# Patient Record
Sex: Male | Born: 1985 | Race: White | Hispanic: No | Marital: Single | Smoking: Current every day smoker
Health system: Southern US, Community
[De-identification: ages and names within clinical notes are randomized; demographics above are authoritative.]

## PROBLEM LIST (undated history)

## (undated) HISTORY — PX: HERNIA REPAIR: SHX51

---
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
## 2006-04-28 ENCOUNTER — Emergency Department (HOSPITAL_COMMUNITY): Admission: EM | Admit: 2006-04-28 | Discharge: 2006-04-28 | Payer: Self-pay | Admitting: Emergency Medicine

## 2008-11-16 ENCOUNTER — Emergency Department (HOSPITAL_COMMUNITY): Admission: EM | Admit: 2008-11-16 | Discharge: 2008-11-16 | Payer: Self-pay | Admitting: Family Medicine

## 2011-01-24 ENCOUNTER — Emergency Department (HOSPITAL_COMMUNITY)
Admission: EM | Admit: 2011-01-24 | Discharge: 2011-01-24 | Disposition: A | Discharge status: Home / Self Care | Attending: Emergency Medicine | Admitting: Emergency Medicine

## 2011-01-24 DIAGNOSIS — S0990XA Unspecified injury of head, initial encounter: Secondary | ICD-10-CM | POA: Insufficient documentation

## 2011-01-24 DIAGNOSIS — S0003XA Contusion of scalp, initial encounter: Secondary | ICD-10-CM | POA: Insufficient documentation

## 2011-01-24 DIAGNOSIS — Y9210 Unspecified residential institution as the place of occurrence of the external cause: Secondary | ICD-10-CM | POA: Insufficient documentation

## 2011-01-25 ENCOUNTER — Encounter (HOSPITAL_COMMUNITY): Payer: Self-pay | Admitting: Emergency Medicine

## 2011-01-25 ENCOUNTER — Emergency Department (HOSPITAL_COMMUNITY)

## 2012-07-21 ENCOUNTER — Emergency Department (HOSPITAL_COMMUNITY)
Admission: EM | Admit: 2012-07-21 | Discharge: 2012-07-21 | Disposition: A | Discharge status: Home / Self Care | Payer: Self-pay | Attending: Emergency Medicine | Admitting: Emergency Medicine

## 2012-07-21 ENCOUNTER — Encounter (HOSPITAL_COMMUNITY): Payer: Self-pay | Admitting: *Deleted

## 2012-07-21 ENCOUNTER — Emergency Department (HOSPITAL_COMMUNITY): Payer: Self-pay

## 2012-07-21 DIAGNOSIS — M25561 Pain in right knee: Secondary | ICD-10-CM

## 2012-07-21 DIAGNOSIS — M25569 Pain in unspecified knee: Secondary | ICD-10-CM | POA: Insufficient documentation

## 2012-07-21 DIAGNOSIS — F17200 Nicotine dependence, unspecified, uncomplicated: Secondary | ICD-10-CM | POA: Insufficient documentation

## 2012-07-21 DIAGNOSIS — M79651 Pain in right thigh: Secondary | ICD-10-CM

## 2012-07-21 MED ORDER — TRAMADOL HCL 50 MG PO TABS: 50.0000 mg | ORAL_TABLET | Freq: Four times a day (QID) | ORAL | Status: AC | PRN

## 2012-07-21 MED ORDER — ONDANSETRON 8 MG PO TBDP
8.0000 mg | ORAL_TABLET | Freq: Once | ORAL | Status: AC
  Administered 2012-07-21: 8 mg via ORAL
  Filled 2012-07-21: qty 1

## 2012-07-21 MED ORDER — PROMETHAZINE HCL 25 MG PO TABS: 25.0000 mg | ORAL_TABLET | Freq: Four times a day (QID) | ORAL | Status: DC | PRN

## 2012-07-21 MED ORDER — OXYCODONE-ACETAMINOPHEN 5-325 MG PO TABS
2.0000 | ORAL_TABLET | Freq: Once | ORAL | Status: AC
  Administered 2012-07-21: 2 via ORAL
  Filled 2012-07-21: qty 2
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
## 2012-07-22 NOTE — ED Provider Notes (Signed)
Medical screening examination/treatment/procedure(s) were performed by non-physician practitioner and as supervising physician I was immediately available for consultation/collaboration.  Toy Baker, MD 07/22/12 [phone]

## 2012-12-25 ENCOUNTER — Ambulatory Visit (HOSPITAL_COMMUNITY)
Admission: EM | Admit: 2012-12-25 | Discharge: 2012-12-27 | Disposition: A | Discharge status: Home / Self Care | Attending: Otolaryngology | Admitting: Otolaryngology

## 2012-12-25 ENCOUNTER — Emergency Department (HOSPITAL_COMMUNITY)

## 2012-12-25 ENCOUNTER — Encounter (HOSPITAL_COMMUNITY): Payer: Self-pay | Admitting: Emergency Medicine

## 2012-12-25 DIAGNOSIS — S02609A Fracture of mandible, unspecified, initial encounter for closed fracture: Secondary | ICD-10-CM | POA: Insufficient documentation

## 2012-12-25 DIAGNOSIS — Y9210 Unspecified residential institution as the place of occurrence of the external cause: Secondary | ICD-10-CM | POA: Insufficient documentation

## 2012-12-25 DIAGNOSIS — R1310 Dysphagia, unspecified: Secondary | ICD-10-CM | POA: Insufficient documentation

## 2012-12-25 DIAGNOSIS — F17200 Nicotine dependence, unspecified, uncomplicated: Secondary | ICD-10-CM | POA: Insufficient documentation

## 2012-12-25 MED ORDER — SODIUM CHLORIDE 0.9 % IV SOLN
INTRAVENOUS | Status: DC
  Administered 2012-12-25: 23:00:00 via INTRAVENOUS

## 2012-12-25 MED ORDER — DIPHENHYDRAMINE HCL 12.5 MG/5ML PO ELIX
12.5000 mg | ORAL_SOLUTION | Freq: Four times a day (QID) | ORAL | Status: DC | PRN
  Filled 2012-12-25: qty 10

## 2012-12-25 MED ORDER — PROMETHAZINE HCL 6.25 MG/5ML PO SYRP
25.0000 mg | ORAL_SOLUTION | ORAL | Status: DC | PRN
  Filled 2012-12-25: qty 20

## 2012-12-25 MED ORDER — KCL IN DEXTROSE-NACL 10-5-0.45 MEQ/L-%-% IV SOLN
INTRAVENOUS | Status: DC
  Administered 2012-12-26 – 2012-12-27 (×2): via INTRAVENOUS
  Filled 2012-12-25 (×4): qty 1000

## 2012-12-25 MED ORDER — SODIUM CHLORIDE 0.9 % IV SOLN
3.0000 g | Freq: Four times a day (QID) | INTRAVENOUS | Status: DC
  Administered 2012-12-25 – 2012-12-27 (×5): 3 g via INTRAVENOUS
  Filled 2012-12-25 (×9): qty 3

## 2012-12-25 MED ORDER — HYDROMORPHONE HCL PF 1 MG/ML IJ SOLN
1.0000 mg | Freq: Once | INTRAMUSCULAR | Status: AC
  Administered 2012-12-25: 1 mg via INTRAMUSCULAR
  Filled 2012-12-25: qty 1

## 2012-12-25 MED ORDER — ACETAMINOPHEN 160 MG/5ML PO SOLN
325.0000 mg | Freq: Four times a day (QID) | ORAL | Status: DC | PRN
  Filled 2012-12-25: qty 10.2

## 2012-12-25 MED ORDER — ONDANSETRON HCL 4 MG/2ML IJ SOLN: 4.0000 mg | Freq: Four times a day (QID) | INTRAMUSCULAR | Status: DC | PRN

## 2012-12-25 MED ORDER — CHLORHEXIDINE GLUCONATE 0.12 % MT SOLN
15.0000 mL | Freq: Four times a day (QID) | OROMUCOSAL | Status: DC
  Administered 2012-12-25 – 2012-12-27 (×4): 15 mL via OROMUCOSAL
  Filled 2012-12-25 (×9): qty 15

## 2012-12-25 MED ORDER — HYDROCODONE-ACETAMINOPHEN 7.5-500 MG/15ML PO SOLN
15.0000 mL | ORAL | Status: DC | PRN
  Administered 2012-12-26: 15 mL via ORAL
  Filled 2012-12-25: qty 15

## 2012-12-25 MED ORDER — MORPHINE SULFATE 2 MG/ML IJ SOLN
1.0000 mg | INTRAMUSCULAR | Status: DC | PRN
  Administered 2012-12-25 – 2012-12-26 (×5): 1 mg via INTRAVENOUS
  Filled 2012-12-25 (×5): qty 1

## 2012-12-25 MED ORDER — DIPHENHYDRAMINE HCL 50 MG/ML IJ SOLN: 12.5000 mg | Freq: Four times a day (QID) | INTRAMUSCULAR | Status: DC | PRN
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
## 2012-12-25 NOTE — ED Provider Notes (Signed)
Medical screening examination/treatment/procedure(s) were performed by non-physician practitioner and as supervising physician I was immediately available for consultation/collaboration.   David H Yao, MD 12/25/12 2302 
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
## 2012-12-26 ENCOUNTER — Encounter (HOSPITAL_COMMUNITY)
Admission: EM | Disposition: A | Discharge status: Home / Self Care | Payer: Self-pay | Source: Home / Self Care | Attending: Emergency Medicine

## 2012-12-26 ENCOUNTER — Observation Stay (HOSPITAL_COMMUNITY): Admitting: Anesthesiology

## 2012-12-26 ENCOUNTER — Ambulatory Visit (HOSPITAL_COMMUNITY)

## 2012-12-26 ENCOUNTER — Encounter (HOSPITAL_COMMUNITY): Payer: Self-pay | Admitting: Anesthesiology

## 2012-12-26 HISTORY — PX: ORIF MANDIBULAR FRACTURE: SHX2127

## 2012-12-26 SURGERY — OPEN REDUCTION INTERNAL FIXATION (ORIF) MANDIBULAR FRACTURE
Anesthesia: General | Site: Mouth | Wound class: Clean Contaminated

## 2012-12-26 MED ORDER — LIDOCAINE HCL (CARDIAC) 20 MG/ML IV SOLN
INTRAVENOUS | Status: DC | PRN
  Administered 2012-12-26: 80 mg via INTRAVENOUS

## 2012-12-26 MED ORDER — OXYCODONE HCL 5 MG PO TABS: 5.0000 mg | ORAL_TABLET | Freq: Once | ORAL | Status: DC | PRN

## 2012-12-26 MED ORDER — LIDOCAINE-EPINEPHRINE 1 %-1:100000 IJ SOLN
INTRAMUSCULAR | Status: DC | PRN
  Administered 2012-12-26: 10 mL

## 2012-12-26 MED ORDER — ONDANSETRON HCL 4 MG/2ML IJ SOLN
INTRAMUSCULAR | Status: DC | PRN
  Administered 2012-12-26: 4 mg via INTRAVENOUS

## 2012-12-26 MED ORDER — HYDROMORPHONE HCL PF 1 MG/ML IJ SOLN
0.2500 mg | INTRAMUSCULAR | Status: DC | PRN
  Administered 2012-12-26 (×2): 0.5 mg via INTRAVENOUS

## 2012-12-26 MED ORDER — DEXAMETHASONE SODIUM PHOSPHATE 10 MG/ML IJ SOLN
10.0000 mg | Freq: Three times a day (TID) | INTRAMUSCULAR | Status: DC
  Administered 2012-12-26 – 2012-12-27 (×4): 10 mg via INTRAVENOUS
  Filled 2012-12-26 (×7): qty 1

## 2012-12-26 MED ORDER — PROPOFOL 10 MG/ML IV BOLUS
INTRAVENOUS | Status: DC | PRN
  Administered 2012-12-26: 200 mg via INTRAVENOUS

## 2012-12-26 MED ORDER — METOCLOPRAMIDE HCL 5 MG/ML IJ SOLN
INTRAMUSCULAR | Status: DC | PRN
  Administered 2012-12-26: 10 mg via INTRAVENOUS

## 2012-12-26 MED ORDER — OXYMETAZOLINE HCL 0.05 % NA SOLN
NASAL | Status: DC | PRN
  Administered 2012-12-26: 1 via NASAL

## 2012-12-26 MED ORDER — LACTATED RINGERS IV SOLN
INTRAVENOUS | Status: DC | PRN
  Administered 2012-12-26 (×2): via INTRAVENOUS

## 2012-12-26 MED ORDER — FENTANYL CITRATE 0.05 MG/ML IJ SOLN
INTRAMUSCULAR | Status: DC | PRN
  Administered 2012-12-26: 100 ug via INTRAVENOUS
  Administered 2012-12-26 (×2): 50 ug via INTRAVENOUS

## 2012-12-26 MED ORDER — ONDANSETRON HCL 4 MG/2ML IJ SOLN: 4.0000 mg | Freq: Once | INTRAMUSCULAR | Status: DC | PRN

## 2012-12-26 MED ORDER — SUCCINYLCHOLINE CHLORIDE 20 MG/ML IJ SOLN
INTRAMUSCULAR | Status: DC | PRN
  Administered 2012-12-26: 120 mg via INTRAVENOUS

## 2012-12-26 MED ORDER — OXYCODONE HCL 5 MG/5ML PO SOLN: 5.0000 mg | Freq: Once | ORAL | Status: DC | PRN

## 2012-12-26 MED ORDER — MIDAZOLAM HCL 5 MG/5ML IJ SOLN
INTRAMUSCULAR | Status: DC | PRN
  Administered 2012-12-26: 2 mg via INTRAVENOUS

## 2012-12-26 MED ORDER — 0.9 % SODIUM CHLORIDE (POUR BTL) OPTIME
TOPICAL | Status: DC | PRN
  Administered 2012-12-26: 1000 mL

## 2012-12-26 SURGICAL SUPPLY — 49 items
BIT DRILL TWIST 1.3X35MM (BIT) ×1 IMPLANT
BLADE SURG 15 STRL LF DISP TIS (BLADE) ×1 IMPLANT
BLADE SURG 15 STRL SS (BLADE) ×1
BLADE SURG ROTATE 9660 (MISCELLANEOUS) IMPLANT
CANISTER SUCTION 2500CC (MISCELLANEOUS) ×2 IMPLANT
CLEANER TIP ELECTROSURG 2X2 (MISCELLANEOUS) ×2 IMPLANT
CLOTH BEACON ORANGE TIMEOUT ST (SAFETY) ×2 IMPLANT
COVER SURGICAL LIGHT HANDLE (MISCELLANEOUS) ×2 IMPLANT
DECANTER SPIKE VIAL GLASS SM (MISCELLANEOUS) ×2 IMPLANT
DRILL TWIST 1.3X35MM (BIT) ×2
DRSG NASOPORE 8CM (GAUZE/BANDAGES/DRESSINGS) ×2 IMPLANT
ELECT COATED BLADE 2.86 ST (ELECTRODE) ×2 IMPLANT
ELECT REM PT RETURN 9FT ADLT (ELECTROSURGICAL) ×2
ELECTRODE REM PT RTRN 9FT ADLT (ELECTROSURGICAL) ×1 IMPLANT
GLOVE BIO SURGEON STRL SZ7 (GLOVE) ×2 IMPLANT
GLOVE SURG SS PI 7.5 STRL IVOR (GLOVE) ×4 IMPLANT
GOWN STRL NON-REIN LRG LVL3 (GOWN DISPOSABLE) ×4 IMPLANT
KIT BASIN OR (CUSTOM PROCEDURE TRAY) ×2 IMPLANT
KIT ROOM TURNOVER OR (KITS) ×2 IMPLANT
NEEDLE 27GAX1X1/2 (NEEDLE) ×2 IMPLANT
NS IRRIG 1000ML POUR BTL (IV SOLUTION) ×2 IMPLANT
PAD ARMBOARD 7.5X6 YLW CONV (MISCELLANEOUS) ×4 IMPLANT
PENCIL BUTTON HOLSTER BLD 10FT (ELECTRODE) ×2 IMPLANT
PLATE 4H FRACTURE (Plate) ×2 IMPLANT
PLATE MNDBLE 3D 4X2 SQUARE (Plate) ×2 IMPLANT
SCISSORS WIRE ANG 4 3/4 DISP (INSTRUMENTS) ×2 IMPLANT
SCREW BONE CROSS PIN 2.0X10MM (Screw) ×6 IMPLANT
SCREW MNDBLE 2.0X8 BONE (Screw) ×8 IMPLANT
SCREW MNDBLE 2.3X10MM BONE (Screw) ×6 IMPLANT
SCREW UPPER FACE 2.0X12MM (Screw) ×4 IMPLANT
SCREW UPPER FACE 2.0X8MM (Screw) ×4 IMPLANT
SUT ETHILON 4 0 CL P 3 (SUTURE) IMPLANT
SUT MON AB 3-0 SH 27 (SUTURE)
SUT MON AB 3-0 SH27 (SUTURE) IMPLANT
SUT PROLENE 6 0 PC 1 (SUTURE) IMPLANT
SUT SILK 2 0 SH (SUTURE) ×8 IMPLANT
SUT STEEL 0 (SUTURE)
SUT STEEL 0 18XMFL TIE 17 (SUTURE) IMPLANT
SUT STEEL 1 (SUTURE) IMPLANT
SUT STEEL 2 (SUTURE) IMPLANT
SUT STEEL 4 (SUTURE) ×2 IMPLANT
SUT VIC AB 3-0 SH 27 (SUTURE) ×2
SUT VIC AB 3-0 SH 27X BRD (SUTURE) ×2 IMPLANT
SUT VICRYL 4-0 PS2 18IN ABS (SUTURE) IMPLANT
TOWEL OR 17X24 6PK STRL BLUE (TOWEL DISPOSABLE) ×2 IMPLANT
TOWEL OR 17X26 10 PK STRL BLUE (TOWEL DISPOSABLE) ×2 IMPLANT
TRAY ENT MC OR (CUSTOM PROCEDURE TRAY) ×2 IMPLANT
TRAY FOLEY CATH 14FRSI W/METER (CATHETERS) IMPLANT
WATER STERILE IRR 1000ML POUR (IV SOLUTION) ×2 IMPLANT

## 2012-12-26 NOTE — Anesthesia Procedure Notes (Signed)
Procedure Name: Intubation Date/Time: 12/26/2012 1:53 AM Performed by: Molli Hazard Pre-anesthesia Checklist: Patient identified, Emergency Drugs available, Suction available and Patient being monitored Patient Re-evaluated:Patient Re-evaluated prior to inductionOxygen Delivery Method: Circle system utilized Preoxygenation: Pre-oxygenation with 100% oxygen Intubation Type: IV induction, Rapid sequence and Cricoid Pressure applied Laryngoscope Size: Miller and 2 Nasal Tubes: Left, Magill forceps- large, utilized, Nasal Rae and Nasal prep performed Tube size: 7.5 mm Number of attempts: 1 Placement Confirmation: ETT inserted through vocal cords under direct vision,  positive ETCO2 and breath sounds checked- equal and bilateral Tube secured with: Tape Dental Injury: Teeth and Oropharynx as per pre-operative assessment

## 2012-12-26 NOTE — Preoperative (Signed)
Beta Blockers   Reason not to administer Beta Blockers:Not Applicable 

## 2012-12-26 NOTE — Progress Notes (Signed)
12/26/2012 5:32 PM  Jordan Steele [id number]  Post op check    Temp:  [98.1 F (36.7 C)-99.9 F (37.7 C)] 98.3 F (36.8 C) (01/02 1455) Pulse Rate:  [71-92] 76  (01/02 1455) Resp:  [14-20] 18  (01/02 1455) BP: (127-154)/(76-93) 127/80 mmHg (01/02 1455) SpO2:  [96 %-100 %] 96 % (01/02 1455) Weight:  [74.6 kg (164 lb 7.4 oz)-81.647 kg (180 lb)] 74.6 kg (164 lb 7.4 oz) (01/02 0418),     Intake/Output Summary (Last 24 hours) at 12/26/12 1732 Last data filed at 12/26/12 1235  Gross per 24 hour  Intake 1825.83 ml  Output    400 ml  Net 1425.83 ml    No results found for this or any previous visit (from the past 24 hour(s)).  SUBJECTIVE:  Just starting to drink.  + spont void.  Pain controlled. No breathing difficulty, but reluctant to talk. C/o LEFT side higher than RIGHT.  OBJECTIVE:   ROM mandible OK.  Sl RIGHT soft palate ecchymosis, but no swelling of tongue or pharynx.  Could not test occlusion.  IMPRESSION:  Satisfactory check. No obvious reason why he can't talk or eat either one.  PLAN:  Advance diet.  Probably discharge in AM.  Lazarus Salines, [place]

## 2012-12-26 NOTE — Progress Notes (Signed)
After coaxing, frequent oral rinses, ice bags to bil jaws, cleansing of tongue coated with blood and pain medication (Morphine IV) pt is able to swallow, approximately 4pm.  Patient is using the oral suction less.  Patient has a fear that he will go to sleep and he want be able to control secretions.  I encouraged him that he will improve over time but should be able to control secretions, cough them up if sitting up and lay to the side.  He was educated to take small sips and swallow slowly.  He is much more awake this afternoon and able to speak his words clearer but still has to repeat some words.   He will try soft but solid food tonight with my presence for safety and reassurance.  Continue to monitor and reassure patient, keep pain to mininum of #4, and switch pain medication to oral most likely crush and put in pudding or applesauce.  Pt has been prepared for his discharge tomorrow.  Bonney Leitz RN

## 2012-12-26 NOTE — Progress Notes (Signed)
Ihor Meinzer [id number] Admitted to 5528: 12/26/2012 4:20AM Attending Provider: Melvenia Beam, MD    Jordan Steele is a 27 y.o. male patient admitted from ED awake, alert  & orientated  X 3,  Full Code, VSS - Blood pressure 138/82, pulse 87, temperature 98.8 F (37.1 C), temperature source Axillary, resp. rate 20, height 6' (1.829 m), weight 72.8 kg (160 lb 7.9 oz), SpO2 97.00%RA, no c/o shortness of breath, no c/o chest pain, no distress noted.   IV site WDL:  forearm right, condition patent and no redness with a transparent dsg that's clean dry and intact.  Allergies:  No Known Allergies   History reviewed. No pertinent past medical history.  History:  obtained from the patient.  Pt orientation to unit, room and routine. Information packet given to patient and safety video watched.  Admission INP armband ID verified with patient/family, and in place. SR up x 2, fall risk assessment complete with Patient verbalizes understanding of risks associated with falls. Pt verbalizes an understanding of how to use the call bell and to call for help before getting out of bed.  Skin, clean-dry- intact without evidence of bruising, or skin tears.  Pt has sutures to his lip s/p Mandibulo-maxillary fixation. Suction and Yankauer set up at bedside.    No evidence of skin break down noted on exam.    Will cont to monitor and assist as needed.  Julien Nordmann [facility], RN 12/26/2012 4:51 AM

## 2012-12-26 NOTE — Anesthesia Preprocedure Evaluation (Signed)
Anesthesia Evaluation  Patient identified by MRN, date of birth, ID band Patient awake    Reviewed: Allergy & Precautions, H&P , NPO status , Patient's Chart, lab work & pertinent test results  Airway  TM Distance: >3 FB   Mouth opening: Limited Mouth Opening  Dental  (+)    Pulmonary Current Smoker,  breath sounds clear to auscultation        Cardiovascular Rhythm:Regular Rate:Normal     Neuro/Psych    GI/Hepatic   Endo/Other    Renal/GU      Musculoskeletal   Abdominal   Peds  Hematology   Anesthesia Other Findings   Reproductive/Obstetrics                           Anesthesia Physical Anesthesia Plan  ASA: I and emergent  Anesthesia Plan: General   Post-op Pain Management:    Induction: Intravenous  Airway Management Planned: Nasal ETT  Additional Equipment:   Intra-op Plan:   Post-operative Plan: Extubation in OR  Informed Consent: I have reviewed the patients History and Physical, chart, labs and discussed the procedure including the risks, benefits and alternatives for the proposed anesthesia with the patient or authorized representative who has indicated his/her understanding and acceptance.   Dental advisory given  Plan Discussed with: CRNA, Anesthesiologist and Surgeon  Anesthesia Plan Comments:         Anesthesia Quick Evaluation

## 2012-12-26 NOTE — Anesthesia Postprocedure Evaluation (Signed)
  Anesthesia Post-op Note  Patient: Jordan Steele  Procedure(s) Performed: Procedure(s) (LRB) with comments: OPEN REDUCTION INTERNAL FIXATION (ORIF) MANDIBULAR FRACTURE (N/A)  Patient Location: PACU  Anesthesia Type:General  Level of Consciousness: awake, alert  and oriented  Airway and Oxygen Therapy: Patient Spontanous Breathing and Patient connected to nasal cannula oxygen  Post-op Pain: moderate  Post-op Assessment: Post-op Vital signs reviewed  Post-op Vital Signs: Reviewed  Complications: No apparent anesthesia complications

## 2012-12-26 NOTE — Transfer of Care (Signed)
Immediate Anesthesia Transfer of Care Note  Patient: Jordan Steele  Procedure(s) Performed: Procedure(s) (LRB) with comments: OPEN REDUCTION INTERNAL FIXATION (ORIF) MANDIBULAR FRACTURE (N/A)  Patient Location: PACU  Anesthesia Type:General  Level of Consciousness: awake, alert  and oriented  Airway & Oxygen Therapy: Patient Spontanous Breathing and Patient connected to nasal cannula oxygen  Post-op Assessment: Report given to PACU RN, Post -op Vital signs reviewed and stable   Post vital signs: Reviewed and stable  Complications: No apparent anesthesia complications
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
## 2012-12-26 NOTE — Plan of Care (Signed)
Problem: Phase I Progression Outcomes Goal: Initial discharge plan identified Outcome: Completed/Met Date Met:  12/26/12 todays d/c canceled due to pt difficulty in swallowing, for safetys sakes

## 2012-12-26 NOTE — Plan of Care (Signed)
Problem: Phase I Progression Outcomes Goal: Sutures/staples intact Outcome: Completed/Met Date Met:  12/26/12 Intact oral surgical site
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
## 2012-12-26 NOTE — Plan of Care (Signed)
Problem: Phase I Progression Outcomes Goal: Tubes/drains patent Outcome: Not Applicable Date Met:  12/26/12 No tubes or drains
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
## 2012-12-27 NOTE — Progress Notes (Signed)
1015 Report called to nurse at [facility]. Spoke with Tonka,LPN also discharge papers were faxed to nurse Tonka,LPN she stated she did receive all the information. Left jaw remains slightly swollen with suture inside of mouth.

## 2012-12-30 ENCOUNTER — Encounter (HOSPITAL_COMMUNITY): Payer: Self-pay | Admitting: Otolaryngology

## 2014-07-29 ENCOUNTER — Emergency Department (HOSPITAL_COMMUNITY)

## 2014-07-29 ENCOUNTER — Emergency Department (HOSPITAL_COMMUNITY)
Admission: EM | Admit: 2014-07-29 | Discharge: 2014-07-29 | Disposition: A | Discharge status: Home / Self Care | Attending: Emergency Medicine | Admitting: Emergency Medicine

## 2014-07-29 ENCOUNTER — Encounter (HOSPITAL_COMMUNITY): Payer: Self-pay | Admitting: Emergency Medicine

## 2014-07-29 DIAGNOSIS — R51 Headache: Secondary | ICD-10-CM | POA: Insufficient documentation

## 2014-07-29 DIAGNOSIS — R05 Cough: Secondary | ICD-10-CM | POA: Insufficient documentation

## 2014-07-29 DIAGNOSIS — J069 Acute upper respiratory infection, unspecified: Secondary | ICD-10-CM | POA: Insufficient documentation

## 2014-07-29 DIAGNOSIS — R197 Diarrhea, unspecified: Secondary | ICD-10-CM | POA: Insufficient documentation

## 2014-07-29 DIAGNOSIS — R059 Cough, unspecified: Secondary | ICD-10-CM | POA: Insufficient documentation

## 2014-07-29 DIAGNOSIS — R509 Fever, unspecified: Secondary | ICD-10-CM | POA: Insufficient documentation

## 2014-07-29 DIAGNOSIS — F17200 Nicotine dependence, unspecified, uncomplicated: Secondary | ICD-10-CM | POA: Insufficient documentation

## 2014-07-29 LAB — RAPID STREP SCREEN (MED CTR MEBANE ONLY): Streptococcus, Group A Screen (Direct): NEGATIVE

## 2014-07-29 MED ORDER — HYDROCODONE-HOMATROPINE 5-1.5 MG/5ML PO SYRP: 5.0000 mL | ORAL_SOLUTION | Freq: Four times a day (QID) | ORAL | Status: DC | PRN

## 2014-07-29 MED ORDER — GUAIFENESIN 100 MG/5ML PO LIQD: 100.0000 mg | ORAL | Status: DC | PRN

## 2014-07-29 NOTE — ED Notes (Signed)
Pt reports cough, sore throat x 1 week. Intermittent fever of 100.0 reported

## 2014-07-29 NOTE — ED Provider Notes (Signed)
CSN: [id number]     Arrival date & time 07/29/14  1246 History  This chart was scribed for Fayrene HelperBowie Maksymilian Mabey, PA-C, working with Doug SouSam Jacubowitz, MD by Chestine SporeSoijett Blue, ED Scribe. The patient was seen in room WTR5/WTR5 at 12:59 PM.     Chief Complaint  Patient presents with  . Cough    x 1 week  . Sore Throat    x1 week      The history is provided by the patient. No language interpreter was used.   HPI Comments: Jordan Steele is a 28 y.o. male who presents to the Emergency Department complaining of cough and sore throat onset 1 week. He states that he is having associated symptoms of cough, intermittent fever of 100, rhinorrhea, sneezing, diarrhea, and HA.  He states that he has tried Dayquil and Nyquil with no relief for his symptoms. He states that he has taken Tylenol extra strength for his HA with no relief. He denies SOB, and N/V. He states that he has had some sick contact. He denies any recent travel. He states that he smokes.    History reviewed. No pertinent past medical history. Past Surgical History  Procedure Laterality Date  . Hernia repair    . Orif mandibular fracture  12/26/2012    Procedure: OPEN REDUCTION INTERNAL FIXATION (ORIF) MANDIBULAR FRACTURE;  Surgeon: Melvenia BeamMitchell Gore, MD;  [facility];  Service: ENT;  Laterality: N/A;   History reviewed. No pertinent family history. History  Substance Use Topics  . Smoking status: Current Every Day Smoker -- 0.50 packs/day  . Smokeless tobacco: Never Used  . Alcohol Use: No    Review of Systems  Constitutional: Positive for fever. Negative for chills.  HENT: Positive for rhinorrhea and sore throat.   Respiratory: Positive for cough. Negative for shortness of breath.   Gastrointestinal: Positive for diarrhea. Negative for nausea and vomiting.  Neurological: Positive for headaches.      Allergies  Review of patient's allergies indicates no known allergies.  Home Medications   Prior to Admission medications   Not on File    BP 121/78  Pulse 94  Temp(Src) 98.4 F (36.9 C) (Oral)  Resp 18  SpO2 98%  Physical Exam  Nursing note and vitals reviewed. Constitutional: He is oriented to person, place, and time. He appears well-developed and well-nourished. No distress.  HENT:  Head: Normocephalic and atraumatic.  Right Ear: Tympanic membrane and ear canal normal.  Left Ear: Tympanic membrane and ear canal normal.  Nose: Nose normal.  Mouth/Throat: Uvula is midline. No oropharyngeal exudate.   No sinus tenderness.    Throat: uvula midline, mild post oropharyngeal erythema, no tonsillar enlargement or exudates noted.    Eyes: EOM are normal.  Neck: Neck supple. No tracheal deviation present.  Cardiovascular: Normal rate and normal heart sounds.   Pulmonary/Chest: Effort normal and breath sounds normal. No respiratory distress.  Musculoskeletal: Normal range of motion.  Neurological: He is alert and oriented to person, place, and time.  Skin: Skin is warm and dry.  Psychiatric: He has a normal mood and affect. His behavior is normal.    ED Course  Procedures (including critical care time) DIAGNOSTIC STUDIES: Oxygen Saturation is 98% on room air, normal by my interpretation.    COORDINATION OF CARE: 1:03 PM-Pt with URI sxs.  Given hx of tobacco abuse, and persistent cough, Discussed treatment plan which includes CXR and Rapid Strep Screen with pt at bedside and pt agreed to plan.   2:13  PM cxr unremarkable, strep test neg.  Will treat sxs of URI.  Smoking cessation discussed.    Labs Review Labs Reviewed  RAPID STREP SCREEN    Imaging Review Dg Chest 2 View  07/29/2014   CLINICAL DATA:  Cough  EXAM: CHEST  2 VIEW  COMPARISON:  None.  FINDINGS: Lungs are clear. Heart size and pulmonary vascularity are normal. No adenopathy. No bone lesions. Incidental note is made of an azygos lobe on the right, an anatomic variant.  IMPRESSION: No edema or consolidation.   Electronically Signed   By: Bretta Bang M.D.   On: 07/29/2014 13:25     EKG Interpretation None      MDM   Final diagnoses:  URI (upper respiratory infection)    BP 121/78  Pulse 94  Temp(Src) 98.4 F (36.9 C) (Oral)  Resp 18  SpO2 98%  I have reviewed nursing notes and vital signs. I personally reviewed the imaging tests through PACS system  I reviewed available ER/hospitalization records thought the EMR   I personally performed the services described in this documentation, which was scribed in my presence. The recorded information has been reviewed and is accurate.    Fayrene Helper, PA-C 07/29/14 1413

## 2014-07-29 NOTE — ED Provider Notes (Signed)
Medical screening examination/treatment/procedure(s) were performed by non-physician practitioner and as supervising physician I was immediately available for consultation/collaboration.   EKG Interpretation None       Doug SouSam Esparanza Krider, MD 07/29/14 1623

## 2014-07-29 NOTE — Discharge Instructions (Signed)
Upper Respiratory Infection, Adult An upper respiratory infection (URI) is also sometimes known as the common cold. The upper respiratory tract includes the nose, sinuses, throat, trachea, and bronchi. Bronchi are the airways leading to the lungs. Most people improve within 1 week, but symptoms can last up to 2 weeks. A residual cough may last even longer.  CAUSES Many different viruses can infect the tissues lining the upper respiratory tract. The tissues become irritated and inflamed and often become very moist. Mucus production is also common. A cold is contagious. You can easily spread the virus to others by oral contact. This includes kissing, sharing a glass, coughing, or sneezing. Touching your mouth or nose and then touching a Rayo, which is then touched by another person, can also spread the virus. SYMPTOMS  Symptoms typically develop 1 to 3 days after you come in contact with a cold virus. Symptoms vary from person to person. They may include:  Runny nose.  Sneezing.  Nasal congestion.  Sinus irritation.  Sore throat.  Loss of voice (laryngitis).  Cough.  Fatigue.  Muscle aches.  Loss of appetite.  Headache.  Low-grade fever. DIAGNOSIS  You might diagnose your own cold based on familiar symptoms, since most people get a cold 2 to 3 times a year. Your caregiver can confirm this based on your exam. Most importantly, your caregiver can check that your symptoms are not due to another disease such as strep throat, sinusitis, pneumonia, asthma, or epiglottitis. Blood tests, throat tests, and X-rays are not necessary to diagnose a common cold, but they may sometimes be helpful in excluding other more serious diseases. Your caregiver will decide if any further tests are required. RISKS AND COMPLICATIONS  You may be at risk for a more severe case of the common cold if you smoke cigarettes, have chronic heart disease (such as heart failure) or lung disease (such as asthma), or if  you have a weakened immune system. The very young and very old are also at risk for more serious infections. Bacterial sinusitis, middle ear infections, and bacterial pneumonia can complicate the common cold. The common cold can worsen asthma and chronic obstructive pulmonary disease (COPD). Sometimes, these complications can require emergency medical care and may be life-threatening. PREVENTION  The best way to protect against getting a cold is to practice good hygiene. Avoid oral or hand contact with people with cold symptoms. Wash your hands often if contact occurs. There is no clear evidence that vitamin C, vitamin E, echinacea, or exercise reduces the chance of developing a cold. However, it is always recommended to get plenty of rest and practice good nutrition. TREATMENT  Treatment is directed at relieving symptoms. There is no cure. Antibiotics are not effective, because the infection is caused by a virus, not by bacteria. Treatment may include:  Increased fluid intake. Sports drinks offer valuable electrolytes, sugars, and fluids.  Breathing heated mist or steam (vaporizer or shower).  Eating chicken soup or other clear broths, and maintaining good nutrition.  Getting plenty of rest.  Using gargles or lozenges for comfort.  Controlling fevers with ibuprofen or acetaminophen as directed by your caregiver.  Increasing usage of your inhaler if you have asthma. Zinc gel and zinc lozenges, taken in the first 24 hours of the common cold, can shorten the duration and lessen the severity of symptoms. Pain medicines may help with fever, muscle aches, and throat pain. A variety of non-prescription medicines are available to treat congestion and runny nose. Your caregiver   can make recommendations and may suggest nasal or lung inhalers for other symptoms.  HOME CARE INSTRUCTIONS   Only take over-the-counter or prescription medicines for pain, discomfort, or fever as directed by your  caregiver.  Use a warm mist humidifier or inhale steam from a shower to increase air moisture. This may keep secretions moist and make it easier to breathe.  Drink enough water and fluids to keep your urine clear or pale yellow.  Rest as needed.  Return to work when your temperature has returned to normal or as your caregiver advises. You may need to stay home longer to avoid infecting others. You can also use a face mask and careful hand washing to prevent spread of the virus. SEEK MEDICAL CARE IF:   After the first few days, you feel you are getting worse rather than better.  You need your caregiver's advice about medicines to control symptoms.  You develop chills, worsening shortness of breath, or brown or red sputum. These may be signs of pneumonia.  You develop yellow or brown nasal discharge or pain in the face, especially when you bend forward. These may be signs of sinusitis.  You develop a fever, swollen neck glands, pain with swallowing, or white areas in the back of your throat. These may be signs of strep throat. SEEK IMMEDIATE MEDICAL CARE IF:   You have a fever.  You develop severe or persistent headache, ear pain, sinus pain, or chest pain.  You develop wheezing, a prolonged cough, cough up blood, or have a change in your usual mucus (if you have chronic lung disease).  You develop sore muscles or a stiff neck. Document Released: 06/06/2001 Document Revised: 03/04/2012 Document Reviewed: 03/18/2014 ExitCare Patient Information 2015 [employer]. This information is not intended to replace advice given to you by your health care provider. Make sure you discuss any questions you have with your health care provider.  

## 2014-07-31 LAB — CULTURE, GROUP A STREP

## 2014-09-22 ENCOUNTER — Emergency Department (HOSPITAL_COMMUNITY)
Admission: EM | Admit: 2014-09-22 | Discharge: 2014-09-22 | Disposition: A | Discharge status: Home / Self Care | Payer: PRIVATE HEALTH INSURANCE | Attending: Emergency Medicine | Admitting: Emergency Medicine

## 2014-09-22 ENCOUNTER — Encounter (HOSPITAL_COMMUNITY): Payer: Self-pay | Admitting: Emergency Medicine

## 2014-09-22 DIAGNOSIS — M5430 Sciatica, unspecified side: Secondary | ICD-10-CM | POA: Insufficient documentation

## 2014-09-22 DIAGNOSIS — M5431 Sciatica, right side: Secondary | ICD-10-CM

## 2014-09-22 DIAGNOSIS — R21 Rash and other nonspecific skin eruption: Secondary | ICD-10-CM | POA: Insufficient documentation

## 2014-09-22 DIAGNOSIS — R209 Unspecified disturbances of skin sensation: Secondary | ICD-10-CM | POA: Insufficient documentation

## 2014-09-22 DIAGNOSIS — F17200 Nicotine dependence, unspecified, uncomplicated: Secondary | ICD-10-CM | POA: Insufficient documentation

## 2014-09-22 DIAGNOSIS — L738 Other specified follicular disorders: Secondary | ICD-10-CM | POA: Insufficient documentation

## 2014-09-22 DIAGNOSIS — L678 Other hair color and hair shaft abnormalities: Secondary | ICD-10-CM | POA: Insufficient documentation

## 2014-09-22 DIAGNOSIS — L739 Follicular disorder, unspecified: Secondary | ICD-10-CM

## 2014-09-22 MED ORDER — PREDNISONE 20 MG PO TABS: ORAL_TABLET | ORAL | Status: DC

## 2014-09-22 MED ORDER — NAPROXEN 500 MG PO TABS: 500.0000 mg | ORAL_TABLET | Freq: Two times a day (BID) | ORAL | Status: DC

## 2014-09-22 NOTE — ED Notes (Signed)
Pt sts he woke up 3 days ago and had pain in his right buttocks that radiates down to his right foot.  Denies injury.  Has taken advil, with no improvement to pain.

## 2014-09-22 NOTE — ED Notes (Addendum)
Paatient c/o right leg pain x 3 days. Patient states he can not stand or sit for long periods of time due to right leg pain. Patient also c/o perineal rash x 3 weeks.  Patient denies any penile drainage. Patient c/o numbness intermittently to the right leg.
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
## 2014-09-23 NOTE — ED Provider Notes (Signed)
Medical screening examination/treatment/procedure(s) were performed by non-physician practitioner and as supervising physician I was immediately available for consultation/collaboration.  Toy CookeyMegan Docherty, MD 09/23/14 [phone]

## 2014-09-25 ENCOUNTER — Emergency Department (INDEPENDENT_AMBULATORY_CARE_PROVIDER_SITE_OTHER)
Admission: EM | Admit: 2014-09-25 | Discharge: 2014-09-25 | Disposition: A | Discharge status: Home / Self Care | Payer: Self-pay | Source: Home / Self Care | Attending: Family Medicine | Admitting: Family Medicine

## 2014-09-25 ENCOUNTER — Encounter (HOSPITAL_COMMUNITY): Payer: Self-pay | Admitting: Emergency Medicine

## 2014-09-25 DIAGNOSIS — M5431 Sciatica, right side: Secondary | ICD-10-CM

## 2014-09-25 MED ORDER — KETOROLAC TROMETHAMINE 30 MG/ML IJ SOLN
30.0000 mg | Freq: Once | INTRAMUSCULAR | Status: AC
  Administered 2014-09-25: 30 mg via INTRAMUSCULAR

## 2014-09-25 MED ORDER — METHYLPREDNISOLONE SODIUM SUCC 125 MG IJ SOLR
125.0000 mg | Freq: Once | INTRAMUSCULAR | Status: AC
  Administered 2014-09-25: 125 mg via INTRAMUSCULAR

## 2014-09-25 MED ORDER — KETOROLAC TROMETHAMINE 30 MG/ML IJ SOLN
INTRAMUSCULAR | Status: AC
  Filled 2014-09-25: qty 1

## 2014-09-25 MED ORDER — HYDROCODONE-ACETAMINOPHEN 5-325 MG PO TABS
1.0000 | ORAL_TABLET | Freq: Four times a day (QID) | ORAL | Status: DC | PRN
Start: 2014-09-25 — End: 2023-05-19

## 2014-09-25 MED ORDER — METHYLPREDNISOLONE SODIUM SUCC 125 MG IJ SOLR
INTRAMUSCULAR | Status: AC
  Filled 2014-09-25: qty 2

## 2014-09-25 NOTE — ED Provider Notes (Signed)
CSN: [id number]     Arrival date & time 09/25/14  1810 History   First MD Initiated Contact with Patient 09/25/14 1832     Chief Complaint  Patient presents with  . Sciatica   (Consider location/radiation/quality/duration/timing/severity/associated sxs/prior Treatment) Patient is a 28 y.o. male presenting with leg pain. The history is provided by the patient.  Leg Pain Location:  Hip, leg, knee and ankle Injury: no   Leg location:  R upper leg and R lower leg Knee location:  R knee Ankle location:  R ankle Pain details:    Quality:  Sharp and shooting   Severity:  Moderate   Progression:  Unchanged Chronicity:  New (seen 9/29 at [facility] and meds given, reports no change) Dislocation: no   Prior injury to area:  No Associated symptoms: no back pain, no decreased ROM and no fever     History reviewed. No pertinent past medical history. Past Surgical History  Procedure Laterality Date  . Hernia repair    . Orif mandibular fracture  12/26/2012    Procedure: OPEN REDUCTION INTERNAL FIXATION (ORIF) MANDIBULAR FRACTURE;  Surgeon: Melvenia Beam, MD;  Location: [facility] OR;  Service: ENT;  Laterality: N/A;   No family history on file. History  Substance Use Topics  . Smoking status: Current Every Day Smoker -- 0.50 packs/day    Types: Cigarettes  . Smokeless tobacco: Never Used  . Alcohol Use: No    Review of Systems  Constitutional: Negative.  Negative for fever.  Gastrointestinal: Negative.   Genitourinary: Negative.   Musculoskeletal: Positive for gait problem. Negative for back pain, joint swelling and myalgias.  Skin: Negative.     Allergies  Review of patient's allergies indicates no known allergies.  Home Medications   Prior to Admission medications   Medication Sig Start Date End Date Taking? Authorizing Provider  acetaminophen (TYLENOL) 500 MG tablet Take 1,000 mg by mouth every 6 (six) hours as needed (For cold symptoms.).    Historical Provider, MD  guaiFENesin  (ROBITUSSIN) 100 MG/5ML liquid Take 5-10 mLs (100-200 mg total) by mouth every 4 (four) hours as needed for cough. 07/29/14   Fayrene Helper, PA-C  HYDROcodone-acetaminophen (NORCO/VICODIN) 5-325 MG per tablet Take 1 tablet by mouth every 6 (six) hours as needed for moderate pain or severe pain. 09/25/14   Linna Hoff, MD  HYDROcodone-homatropine [facility]) 5-1.5 MG/5ML syrup Take 5 mLs by mouth every 6 (six) hours as needed for cough. 07/29/14   Fayrene Helper, PA-C  naproxen (NAPROSYN) 500 MG tablet Take 1 tablet (500 mg total) by mouth 2 (two) times daily. 09/22/14   Earle Gell Cartner, PA-C  predniSONE (DELTASONE) 20 MG tablet 3 tabs po daily x 3 days, then 2 tabs x 3 days, then 1.5 tabs x 3 days, then 1 tab x 3 days, then 0.5 tabs x 3 days 09/22/14   Earle Gell Cartner, PA-C   BP 162/73  Pulse 58  Temp(Src) 98.2 F (36.8 C) (Oral)  Resp 18  SpO2 98% Physical Exam  Nursing note and vitals reviewed. Constitutional: He is oriented to person, place, and time. He appears well-developed and well-nourished.  Abdominal: Soft. Bowel sounds are normal. He exhibits no mass. There is no tenderness.  Musculoskeletal: He exhibits tenderness.       Legs: Neurological: He is alert and oriented to person, place, and time.  Skin: Skin is warm and dry.    ED Course  Procedures (including critical care time) Labs Review Labs Reviewed - No  data to display  Imaging Review No results found.   MDM   1. Sciatica neuralgia, right        Linna HoffJames D Kindl, MD 09/25/14 [phone]

## 2014-09-25 NOTE — ED Notes (Signed)
Patient reports he was seen and treated for sciatica x 3 days ago. Patient reports that he was given Prednisone and Naproxen with no relief. Last dose this morning. Patient is alert and oriented. NAD.

## 2014-09-25 NOTE — Discharge Instructions (Signed)
Finish your medicines that you have been taking, see orthopedist next week for recheck.

## 2014-09-26 ENCOUNTER — Encounter (HOSPITAL_COMMUNITY): Payer: Self-pay | Admitting: Emergency Medicine

## 2014-09-26 ENCOUNTER — Emergency Department (HOSPITAL_COMMUNITY)
Admission: EM | Admit: 2014-09-26 | Discharge: 2014-09-26 | Disposition: A | Discharge status: Home / Self Care | Payer: Self-pay | Attending: Emergency Medicine | Admitting: Emergency Medicine

## 2014-09-26 DIAGNOSIS — L0201 Cutaneous abscess of face: Secondary | ICD-10-CM

## 2014-09-26 DIAGNOSIS — L03211 Cellulitis of face: Secondary | ICD-10-CM

## 2014-09-26 DIAGNOSIS — K130 Diseases of lips: Secondary | ICD-10-CM | POA: Insufficient documentation

## 2014-09-26 DIAGNOSIS — Z791 Long term (current) use of non-steroidal anti-inflammatories (NSAID): Secondary | ICD-10-CM | POA: Insufficient documentation

## 2014-09-26 DIAGNOSIS — Z720 Tobacco use: Secondary | ICD-10-CM | POA: Insufficient documentation

## 2014-09-26 DIAGNOSIS — Z7951 Long term (current) use of inhaled steroids: Secondary | ICD-10-CM | POA: Insufficient documentation

## 2014-09-26 MED ORDER — CLINDAMYCIN HCL 300 MG PO CAPS
300.0000 mg | ORAL_CAPSULE | Freq: Once | ORAL | Status: AC
  Administered 2014-09-26: 300 mg via ORAL
  Filled 2014-09-26: qty 1

## 2014-09-26 MED ORDER — CLINDAMYCIN HCL 300 MG PO CAPS: 300.0000 mg | ORAL_CAPSULE | Freq: Four times a day (QID) | ORAL | Status: DC

## 2014-09-26 MED ORDER — HYDROCODONE-ACETAMINOPHEN 5-325 MG PO TABS: 1.0000 | ORAL_TABLET | ORAL | Status: DC | PRN

## 2014-09-26 MED ORDER — HYDROMORPHONE HCL 2 MG/ML IJ SOLN
2.0000 mg | Freq: Once | INTRAMUSCULAR | Status: AC
  Administered 2014-09-26: 2 mg via INTRAMUSCULAR
  Filled 2014-09-26: qty 1

## 2014-09-26 MED ORDER — ONDANSETRON 4 MG PO TBDP
4.0000 mg | ORAL_TABLET | Freq: Once | ORAL | Status: AC
  Administered 2014-09-26: 4 mg via ORAL
  Filled 2014-09-26: qty 1

## 2014-09-26 MED ORDER — LIDOCAINE HCL (PF) 1 % IJ SOLN
2.0000 mL | Freq: Once | INTRAMUSCULAR | Status: DC
  Filled 2014-09-26: qty 2

## 2014-09-26 NOTE — Discharge Instructions (Signed)
Facial Infection °You have an infection of your face. This requires special attention to help prevent serious problems. Infections in facial wounds can cause poor healing and scars. They can also spread to deeper tissues, especially around the eye. Wound and dental infections can lead to sinusitis, infection of the eye socket, and even meningitis. Permanent damage to the skin, eye, and nervous system may result if facial infections are not treated properly. With severe infections, hospital care for IV antibiotic injections may be needed if they don't respond to oral antibiotics. °Antibiotics must be taken for the full course to insure the infection is eliminated. If the infection came from a bad tooth, it may have to be extracted when the infection is under control. Warm compresses may be applied to reduce skin irritation and remove drainage. °You might need a tetanus shot now if: °· You cannot remember when your last tetanus shot was. °· You have never had a tetanus shot. °· The object that caused your wound was dirty. °If you need a tetanus shot, and you decide not to get one, there is a rare chance of getting tetanus. Sickness from tetanus can be serious. If you got a tetanus shot, your arm may swell, get red and warm to the touch at the shot site. This is common and not a problem. °SEEK IMMEDIATE MEDICAL CARE IF:  °· You have increased swelling, redness, or trouble breathing. °· You have a severe headache, dizziness, nausea, or vomiting. °· You develop problems with your eyesight. °· You have a fever. °Document Released: 01/18/2005 Document Revised: 03/04/2012 Document Reviewed: 12/11/2005 °ExitCare® Patient Information ©2015 [employer]. This information is not intended to replace advice given to you by your health care provider. Make sure you discuss any questions you have with your health care provider. ° °

## 2014-09-26 NOTE — ED Notes (Signed)
Bed: WA17 Expected date:  Expected time:  Means of arrival:  Comments: 

## 2014-09-26 NOTE — ED Notes (Signed)
Pt started on Prednisone 5 days ago, swelling of lips started 3 days ago and has become worse. No airway impairment.

## 2014-09-26 NOTE — ED Notes (Signed)
Pt has ? Ingrown hair vs abscess to upper lip which could result in the swelling

## 2014-09-26 NOTE — ED Provider Notes (Signed)
CSN: [id number]     Arrival date & time 09/26/14  1803 History   First MD Initiated Contact with Patient 09/26/14 1806     Chief Complaint  Patient presents with  . Allergic Reaction     (Consider location/radiation/quality/duration/timing/severity/associated sxs/prior Treatment) HPI Comments: Patient presents with swelling to his upper lip. He states he recently started prednisone for sciatica and has had associated swelling over the last 5 days. He states it's gotten worse and more painful over last 2-3 days. He denies any other facial swelling. He denies any tongue swelling. He denies any fevers or chills. He states the swelling of his upper lip started with a small bump and has progressed in the last few days. He denies any other past history of skin infections. He denies any rash or itching.  Patient is a 28 y.o. male presenting with allergic reaction.  Allergic Reaction Presenting symptoms: no rash     History reviewed. No pertinent past medical history. Past Surgical History  Procedure Laterality Date  . Hernia repair    . Orif mandibular fracture  12/26/2012    Procedure: OPEN REDUCTION INTERNAL FIXATION (ORIF) MANDIBULAR FRACTURE;  Surgeon: Melvenia Beam, MD;  Location: [facility] OR;  Service: ENT;  Laterality: N/A;   No family history on file. History  Substance Use Topics  . Smoking status: Current Every Day Smoker -- 0.50 packs/day    Types: Cigarettes  . Smokeless tobacco: Never Used  . Alcohol Use: No    Review of Systems  Constitutional: Negative for fever, chills, diaphoresis and fatigue.  HENT: Positive for facial swelling. Negative for congestion, rhinorrhea and sneezing.   Eyes: Negative.   Respiratory: Negative for cough, chest tightness and shortness of breath.   Cardiovascular: Negative for chest pain and leg swelling.  Gastrointestinal: Negative for nausea, vomiting, abdominal pain, diarrhea and blood in stool.  Genitourinary: Negative for frequency, hematuria,  flank pain and difficulty urinating.  Musculoskeletal: Negative for arthralgias and back pain.  Skin: Negative for rash.  Neurological: Negative for dizziness, speech difficulty, weakness, numbness and headaches.      Allergies  Review of patient's allergies indicates no active allergies.  Home Medications   Prior to Admission medications   Medication Sig Start Date End Date Taking? Authorizing Provider  HYDROcodone-acetaminophen (NORCO/VICODIN) 5-325 MG per tablet Take 1 tablet by mouth every 6 (six) hours as needed for moderate pain or severe pain. 09/25/14  Yes Linna Hoff, MD  naproxen (NAPROSYN) 500 MG tablet Take 1 tablet (500 mg total) by mouth 2 (two) times daily. 09/22/14  Yes Earle Gell Cartner, PA-C  predniSONE (DELTASONE) 20 MG tablet 3 tabs po daily x 3 days, then 2 tabs x 3 days, then 1.5 tabs x 3 days, then 1 tab x 3 days, then 0.5 tabs x 3 days 09/22/14  Yes Earle Gell Cartner, PA-C  clindamycin (CLEOCIN) 300 MG capsule Take 1 capsule (300 mg total) by mouth 4 (four) times daily. X 10 days 09/26/14   Rolan Bucco, MD  HYDROcodone-acetaminophen (NORCO/VICODIN) 5-325 MG per tablet Take 1-2 tablets by mouth every 4 (four) hours as needed for moderate pain or severe pain. 09/26/14   Rolan Bucco, MD   BP 147/81  Pulse 68  Temp(Src) 98.3 F (36.8 C) (Oral)  Resp 18  Wt 155 lb (70.308 kg)  SpO2 99% Physical Exam  Constitutional: He is oriented to person, place, and time. He appears well-developed and well-nourished.  HENT:  Head: Normocephalic and atraumatic.  Patient has  a firm tender area of swelling to his upper lip. There is an area of fluctuance. It's mildly erythematous there is no other associated facial swelling or erythema. Uvula is midline. There is no swelling of the tongue.  Eyes: Pupils are equal, round, and reactive to light.  Neck: Normal range of motion. Neck supple.  Cardiovascular: Normal rate, regular rhythm and normal heart sounds.   Pulmonary/Chest:  Effort normal and breath sounds normal. No respiratory distress. He has no wheezes. He has no rales. He exhibits no tenderness.  Abdominal: Soft. Bowel sounds are normal. There is no tenderness. There is no rebound and no guarding.  Musculoskeletal: Normal range of motion. He exhibits no edema.  Lymphadenopathy:    He has no cervical adenopathy.  Neurological: He is alert and oriented to person, place, and time.  Skin: Skin is warm and dry. No rash noted.  Psychiatric: He has a normal mood and affect.    ED Course  INCISION AND DRAINAGE Date/Time: 09/26/2014 7:48 PM Performed by: Ollin Hochmuth Authorized by: Rolan BuccoBELFI, Shellene Sweigert Consent: Verbal consent obtained. Risks and benefits: risks, benefits and alternatives were discussed Consent given by: patient Type: abscess Body area: head/neck Location details: face Anesthesia: local infiltration Local anesthetic: lidocaine 1% without epinephrine Anesthetic total: 1 ml Patient sedated: no Scalpel size: 11 Incision type: single straight Complexity: simple Drainage: purulent Drainage amount: scant   (including critical care time) Labs Review Labs Reviewed - No data to display  Imaging Review No results found.   EKG Interpretation None      MDM   Final diagnoses:  Cellulitis and abscess of face    I attempted to open the abscess at the pointed area of the upper lip, did not cross vermilian border.  I got a small amount of purulent drainage from area, I attempted to deloculate area, but no further drainage noted.  Will start pt on abx, warm compresses.  Advised to return if no improvement in 2 days or for any worsening symptoms.  TDAP UTD.  PT has pain meds at home for his sciatica, but will be out in 2 days.  Gave him a rx for a few more.    Rolan BuccoMelanie Taveon Enyeart, MD 09/26/14 [id number]

## 2014-09-27 MED ORDER — HYDROCODONE-ACETAMINOPHEN 5-325 MG PO TABS: 1.0000 | ORAL_TABLET | ORAL | Status: DC | PRN

## 2014-09-27 NOTE — ED Provider Notes (Signed)
Pharmacy would not honor patient's prescription for [facility] yesterday by Dr.Belfi. I removed a prescription. Original prescription form was brought back to the emergency department and placed in shredder box  Doug SouSam Donnalyn Juran, MD 09/27/14 1710

## 2014-10-20 ENCOUNTER — Emergency Department (HOSPITAL_COMMUNITY)
Admission: EM | Admit: 2014-10-20 | Discharge: 2014-10-20 | Disposition: A | Discharge status: Home / Self Care | Payer: Self-pay | Attending: Emergency Medicine | Admitting: Emergency Medicine

## 2014-10-20 ENCOUNTER — Encounter (HOSPITAL_COMMUNITY): Payer: Self-pay | Admitting: Emergency Medicine

## 2014-10-20 DIAGNOSIS — Z791 Long term (current) use of non-steroidal anti-inflammatories (NSAID): Secondary | ICD-10-CM | POA: Insufficient documentation

## 2014-10-20 DIAGNOSIS — Z792 Long term (current) use of antibiotics: Secondary | ICD-10-CM | POA: Insufficient documentation

## 2014-10-20 DIAGNOSIS — K130 Diseases of lips: Secondary | ICD-10-CM | POA: Insufficient documentation

## 2014-10-20 DIAGNOSIS — Z7952 Long term (current) use of systemic steroids: Secondary | ICD-10-CM | POA: Insufficient documentation

## 2014-10-20 DIAGNOSIS — Z720 Tobacco use: Secondary | ICD-10-CM | POA: Insufficient documentation

## 2014-10-20 DIAGNOSIS — L0291 Cutaneous abscess, unspecified: Secondary | ICD-10-CM

## 2014-10-20 MED ORDER — CEPHALEXIN 250 MG PO CAPS
250.0000 mg | ORAL_CAPSULE | Freq: Once | ORAL | Status: AC
  Administered 2014-10-20: 250 mg via ORAL
  Filled 2014-10-20: qty 1

## 2014-10-20 MED ORDER — LIDOCAINE HCL 2 % IJ SOLN
5.0000 mL | Freq: Once | INTRAMUSCULAR | Status: AC
  Administered 2014-10-20: 100 mg
  Filled 2014-10-20: qty 20

## 2014-10-20 MED ORDER — BUPIVACAINE HCL 0.5 % IJ SOLN
50.0000 mL | Freq: Once | INTRAMUSCULAR | Status: AC
  Administered 2014-10-20: 50 mL
  Filled 2014-10-20: qty 50

## 2014-10-20 MED ORDER — HYDROCODONE-ACETAMINOPHEN 5-325 MG PO TABS: 1.0000 | ORAL_TABLET | Freq: Four times a day (QID) | ORAL | Status: DC | PRN

## 2014-10-20 MED ORDER — OXYCODONE-ACETAMINOPHEN 5-325 MG PO TABS
1.0000 | ORAL_TABLET | Freq: Once | ORAL | Status: AC
  Administered 2014-10-20: 1 via ORAL
  Filled 2014-10-20: qty 1

## 2014-10-20 MED ORDER — CEPHALEXIN 250 MG PO CAPS: 250.0000 mg | ORAL_CAPSULE | Freq: Four times a day (QID) | ORAL | Status: DC

## 2014-10-20 NOTE — ED Notes (Signed)
Pt was placed on antibiotics for lip infection x 3 weeks ago.  Swelling continues

## 2014-10-20 NOTE — Discharge Instructions (Signed)
Abscess Care After An abscess (also called a boil or furuncle) is an infected area that contains a collection of pus. Signs and symptoms of an abscess include pain, tenderness, redness, or hardness, or you may feel a moveable soft area under your skin. An abscess can occur anywhere in the body. The infection may spread to surrounding tissues causing cellulitis. A cut (incision) by the surgeon was made over your abscess and the pus was drained out. Gauze may have been packed into the space to provide a drain that will allow the cavity to heal from the inside outwards. The boil may be painful for 5 to 7 days. Most people with a boil do not have high fevers. Your abscess, if seen early, may not have localized, and may not have been lanced. If not, another appointment may be required for this if it does not get better on its own or with medications. HOME CARE INSTRUCTIONS   Only take over-the-counter or prescription medicines for pain, discomfort, or fever as directed by your caregiver.  When you bathe, soak and then remove gauze or iodoform packs at least daily or as directed by your caregiver. You may then wash the wound gently with mild soapy water. Repack with gauze or do as your caregiver directs. SEEK IMMEDIATE MEDICAL CARE IF:   You develop increased pain, swelling, redness, drainage, or bleeding in the wound site.  You develop signs of generalized infection including muscle aches, chills, fever, or a general ill feeling.  An oral temperature above 102 F (38.9 C) develops, not controlled by medication. See your caregiver for a recheck if you develop any of the symptoms described above. If medications (antibiotics) were prescribed, take them as directed. Document Released: 06/29/2005 Document Revised: 03/04/2012 Document Reviewed: 02/24/2008 [facility] Patient Information 2015 [place], [place]. This information is not intended to replace advice given to you by your health care provider. Make sure  you discuss any questions you have with your health care provider. Warm compresses to the area 4 times daily for 2 days

## 2014-10-20 NOTE — ED Provider Notes (Signed)
CSN: [id number]     Arrival date & time 10/20/14  1706 History   First MD Initiated Contact with Patient 10/20/14 1955     Chief Complaint  Patient presents with  . Oral Swelling     (Consider location/radiation/quality/duration/timing/severity/associated sxs/prior Treatment) HPI Comments: Had a abscess of upper lip 3 weeks ago treated with drainage and antibiotics with only partial relief now getting bigger over the past several days   The history is provided by the patient.    History reviewed. No pertinent past medical history. Past Surgical History  Procedure Laterality Date  . Hernia repair    . Orif mandibular fracture  12/26/2012    Procedure: OPEN REDUCTION INTERNAL FIXATION (ORIF) MANDIBULAR FRACTURE;  Surgeon: Melvenia BeamMitchell Gore, MD;  [facility];  Service: ENT;  Laterality: N/A;   History reviewed. No pertinent family history. History  Substance Use Topics  . Smoking status: Current Every Day Smoker -- 0.50 packs/day    Types: Cigarettes  . Smokeless tobacco: Never Used  . Alcohol Use: No    Review of Systems  Constitutional: Negative for fever.  HENT: Positive for facial swelling. Negative for trouble swallowing.   Neurological: Negative for weakness.  All other systems reviewed and are negative.     Allergies  Review of patient's allergies indicates no known allergies.  Home Medications   Prior to Admission medications   Medication Sig Start Date End Date Taking? Authorizing Provider  cephALEXin (KEFLEX) 250 MG capsule Take 1 capsule (250 mg total) by mouth 4 (four) times daily. 10/20/14   Arman FilterGail K Gage Weant, NP  clindamycin (CLEOCIN) 300 MG capsule Take 1 capsule (300 mg total) by mouth 4 (four) times daily. X 10 days 09/26/14   Rolan BuccoMelanie Belfi, MD  HYDROcodone-acetaminophen (NORCO) 5-325 MG per tablet Take 1 tablet by mouth every 6 (six) hours as needed for moderate pain or severe pain. 10/20/14   Arman FilterGail K Leahmarie Gasiorowski, NP  HYDROcodone-acetaminophen (NORCO/VICODIN)  5-325 MG per tablet Take 1 tablet by mouth every 6 (six) hours as needed for moderate pain or severe pain. 09/25/14   Linna HoffJames D Kindl, MD  HYDROcodone-acetaminophen (NORCO/VICODIN) 5-325 MG per tablet Take 1-2 tablets by mouth every 4 (four) hours as needed for moderate pain or severe pain. 09/26/14   Rolan BuccoMelanie Belfi, MD  naproxen (NAPROSYN) 500 MG tablet Take 1 tablet (500 mg total) by mouth 2 (two) times daily. 09/22/14   Earle GellBenjamin W Cartner, PA-C  predniSONE (DELTASONE) 20 MG tablet 3 tabs po daily x 3 days, then 2 tabs x 3 days, then 1.5 tabs x 3 days, then 1 tab x 3 days, then 0.5 tabs x 3 days 09/22/14   Earle GellBenjamin W Cartner, PA-C   BP 129/73  Pulse 70  Temp(Src) 97.9 F (36.6 C) (Oral)  Resp 18  SpO2 100% Physical Exam  Nursing note and vitals reviewed. Constitutional: He appears well-developed and well-nourished.  HENT:  Head: Normocephalic.    Right Ear: External ear normal.  Left Ear: External ear normal.  Mouth/Throat: Oropharynx is clear and moist.  Eyes: Pupils are equal, round, and reactive to light.  Cardiovascular: Normal rate.   Pulmonary/Chest: Effort normal.  Musculoskeletal: Normal range of motion.  Lymphadenopathy:    He has no cervical adenopathy.  Neurological: He is alert.  Skin: Skin is warm.    ED Course  INCISION AND DRAINAGE Date/Time: 10/20/2014 9:09 PM Performed by: Arman FilterSCHULZ, Braeleigh Pyper K Authorized by: Arman FilterSCHULZ, Rosaline Ezekiel K Consent: Verbal consent obtained. written consent not obtained. Risks and  benefits: risks, benefits and alternatives were discussed Consent given by: patient Patient understanding: patient states understanding of the procedure being performed Patient identity confirmed: verbally with patient Type: abscess Body area: head/neck Location details: face Anesthesia: local infiltration Local anesthetic: lidocaine 2% without epinephrine and bupivacaine 0.5% without epinephrine Anesthetic total: 3 ml Patient sedated: no Scalpel size: 11 Needle gauge:  22 Incision type: single straight Complexity: simple Drainage: purulent Drainage amount: moderate Wound treatment: wound left open Patient tolerance: Patient tolerated the procedure well with no immediate complications.   (including critical care time) Labs Review Labs Reviewed - No data to display  Imaging Review No results found.   EKG Interpretation None     Patient to apply warm compress 4 times daily for 2 days given Rx for Keflex and Vidodin MDM   Final diagnoses:  Abscess         Arman FilterGail K Taher Vannote, NP 10/20/14 2111

## 2014-10-20 NOTE — ED Provider Notes (Signed)
Medical screening examination/treatment/procedure(s) were performed by non-physician practitioner and as supervising physician I was immediately available for consultation/collaboration.   EKG Interpretation None       Arby BarretteMarcy Evelio Rueda, MD 10/20/14 2241

## 2014-10-20 NOTE — ED Notes (Signed)
Called for patient No answer. 

## 2014-10-20 NOTE — ED Notes (Signed)
AVS explained in detail. No other questions/concerns. Knows to take entire abx regimen. Knows not to drink/drive/operate heavy machinery and take tylenol while taking prescribed pain medication. Educated on proper wound care.

## 2015-11-08 ENCOUNTER — Encounter (HOSPITAL_COMMUNITY): Payer: Self-pay | Admitting: *Deleted

## 2015-11-08 ENCOUNTER — Emergency Department (HOSPITAL_COMMUNITY)
Admission: EM | Admit: 2015-11-08 | Discharge: 2015-11-08 | Disposition: A | Discharge status: Home / Self Care | Payer: Self-pay | Attending: Emergency Medicine | Admitting: Emergency Medicine

## 2015-11-08 DIAGNOSIS — F17210 Nicotine dependence, cigarettes, uncomplicated: Secondary | ICD-10-CM | POA: Insufficient documentation

## 2015-11-08 DIAGNOSIS — Z791 Long term (current) use of non-steroidal anti-inflammatories (NSAID): Secondary | ICD-10-CM | POA: Insufficient documentation

## 2015-11-08 DIAGNOSIS — Z4802 Encounter for removal of sutures: Secondary | ICD-10-CM | POA: Insufficient documentation

## 2015-11-08 NOTE — ED Provider Notes (Signed)
CSN: [id number]     Arrival date & time 11/08/15  1046 History  By signing my name below, I, Jordan Steele, attest that this documentation has been prepared under the direction and in the presence of Melburn HakeNicole Dahl Higinbotham, [place]. Electronically Signed: Marica OtterNusrat Steele, ED Scribe. 11/08/2015. 12:23 PM.  Chief Complaint  Patient presents with  . Suture / Staple Removal   The history is provided by the patient. No language interpreter was used.   PCP: No PCP Per Patient HPI Comments: Jordan Steele is a 29 y.o. male, with PMHx noted below, who presents to the Emergency Department for suture removal from his left hand. Pt reports the [place]. Pt denies fever, chills, drainage from affected area, redness or swelling,pain or any other Sx at this time. Pt reports he was given antibiotics when the sutures were placed and has 2 more pills left to take. Pt reports he is UTD on his tetanus vaccine.    History reviewed. No pertinent past medical history. Past Surgical History  Procedure Laterality Date  . Hernia repair    . Orif mandibular fracture  12/26/2012    Procedure: OPEN REDUCTION INTERNAL FIXATION (ORIF) MANDIBULAR FRACTURE;  Surgeon: Melvenia BeamMitchell Gore, MD;  [facility];  Service: ENT;  Laterality: N/A;   History reviewed. No pertinent family history. Social History  Substance Use Topics  . Smoking status: Current Every Day Smoker -- 0.50 packs/day    Types: Cigarettes  . Smokeless tobacco: Never Used  . Alcohol Use: No    Review of Systems  Constitutional: Negative for fever and chills.  Musculoskeletal: Negative for arthralgias.  Skin: Negative for color change.       Suture removal from left hand    Allergies  Review of patient's allergies indicates no known allergies.  Home Medications   Prior to Admission medications   Medication Sig Start Date End Date Taking? Authorizing Provider  cephALEXin (KEFLEX) 250 MG capsule Take 1  capsule (250 mg total) by mouth 4 (four) times daily. 10/20/14   Earley FavorGail Schulz, NP  clindamycin (CLEOCIN) 300 MG capsule Take 1 capsule (300 mg total) by mouth 4 (four) times daily. X 10 days 09/26/14   Rolan BuccoMelanie Belfi, MD  HYDROcodone-acetaminophen (NORCO) 5-325 MG per tablet Take 1 tablet by mouth every 6 (six) hours as needed for moderate pain or severe pain. 10/20/14   Earley FavorGail Schulz, NP  HYDROcodone-acetaminophen (NORCO/VICODIN) 5-325 MG per tablet Take 1 tablet by mouth every 6 (six) hours as needed for moderate pain or severe pain. 09/25/14   Linna HoffJames D Kindl, MD  HYDROcodone-acetaminophen (NORCO/VICODIN) 5-325 MG per tablet Take 1-2 tablets by mouth every 4 (four) hours as needed for moderate pain or severe pain. 09/26/14   Rolan BuccoMelanie Belfi, MD  naproxen (NAPROSYN) 500 MG tablet Take 1 tablet (500 mg total) by mouth 2 (two) times daily. 09/22/14   Joycie PeekBenjamin Cartner, PA-C  predniSONE (DELTASONE) 20 MG tablet 3 tabs po daily x 3 days, then 2 tabs x 3 days, then 1.5 tabs x 3 days, then 1 tab x 3 days, then 0.5 tabs x 3 days 09/22/14   Joycie PeekBenjamin Cartner, PA-C   Triage Vitals: BP 131/80 mmHg  Pulse 72  Temp(Src) 97.3 F (36.3 C) (Oral)  Resp 16  SpO2 100% Physical Exam  Constitutional: He is oriented to person, place, and time. He appears well-developed and well-nourished.  HENT:  Head: Normocephalic and atraumatic.  Eyes: Conjunctivae and EOM are normal. Right  eye exhibits no discharge. Left eye exhibits no discharge. No scleral icterus.  Neck: Normal range of motion.  Pulmonary/Chest: Effort normal.  Abdominal: He exhibits no distension.  Musculoskeletal: Normal range of motion.  FROM of left thumb and hand. 2+ radial pulses. Sensation intact. 5/5 strength of BUE, equal grip strength.   Neurological: He is alert and oriented to person, place, and time.  Skin: Skin is warm and dry.  6 cm well healing laceration on left lateral thumb with 8 simple interrupted sutures in place. No swelling, erythema,  drainage or tenderness.   Psychiatric: He has a normal mood and affect.  Nursing note and vitals reviewed.   ED Course  .Suture Removal Date/Time: 11/08/2015 12:27 PM Performed by: Barrett Henle Authorized by: Barrett Henle Consent: Verbal consent obtained. Risks and benefits: risks, benefits and alternatives were discussed Consent given by: patient Patient understanding: patient states understanding of the procedure being performed Patient identity confirmed: verbally with patient Body area: upper extremity Location details: left thumb Wound Appearance: clean Sutures Removed: 8 Sutures placed in this facility: Sutures were placed by ED in [place]. Patient tolerance: Patient tolerated the procedure well with no immediate complications   (including critical care time) DIAGNOSTIC STUDIES: Oxygen Saturation is 100% on ra, nl by my interpretation.    COORDINATION OF CARE: 12:15 PM: Discussed treatment plan which includes suture removal with pt at bedside; patient verbalizes understanding and agrees with treatment plan.  MDM   Final diagnoses:  Visit for suture removal    Patient presents for suture removal. He reports having 8 sutures placed in his left thumb approximately 2 weeks ago in [place]. Tetanus up-to-date. He has almost completed his antibiotic prescription. Denies any swelling, redness, drainage. VSS. Exam revealed 6 cm well healing laceration to the left lateral thumb with 8 simple interrupted sutures in place. 8 sutures were removed without any complications. Plan to discharge home.  I personally performed the services described in this documentation, which was scribed in my presence. The recorded information has been reviewed and is accurate.    Satira Sark [place], [place] 11/08/15 1227  Geoffery Lyons, MD 11/08/15 1415

## 2015-11-08 NOTE — Discharge Instructions (Signed)
Return to the emergency department if you began to have swelling, redness, tenderness, drainage.

## 2015-11-08 NOTE — ED Notes (Signed)
Pt reports having sutures placed to left hand approx 3 weeks ago and needs them removed.

## 2023-05-26 DEATH — deceased
